# Patient Record
Sex: Male | Born: 1970 | Hispanic: Yes | Marital: Single | Smoking: Never smoker
Health system: Southern US, Community
[De-identification: ages and names within clinical notes are randomized; demographics above are authoritative.]

## PROBLEM LIST (undated history)

## (undated) DIAGNOSIS — I10 Essential (primary) hypertension: Secondary | ICD-10-CM

---

## 2017-11-24 ENCOUNTER — Emergency Department (HOSPITAL_COMMUNITY)
Admission: EM | Admit: 2017-11-24 | Discharge: 2017-11-24 | Disposition: A | Discharge status: Home / Self Care | Payer: Self-pay | Attending: Emergency Medicine | Admitting: Emergency Medicine

## 2017-11-24 ENCOUNTER — Encounter (HOSPITAL_COMMUNITY): Payer: Self-pay | Admitting: Emergency Medicine

## 2017-11-24 DIAGNOSIS — R51 Headache: Secondary | ICD-10-CM

## 2017-11-24 DIAGNOSIS — I160 Hypertensive urgency: Secondary | ICD-10-CM | POA: Insufficient documentation

## 2017-11-24 DIAGNOSIS — R519 Headache, unspecified: Secondary | ICD-10-CM

## 2017-11-24 HISTORY — DX: Essential (primary) hypertension: I10

## 2017-11-24 LAB — CBC WITH DIFFERENTIAL/PLATELET
BASOS ABS: 0 10*3/uL (ref 0.0–0.1)
BASOS PCT: 0 %
Eosinophils Absolute: 0 10*3/uL (ref 0.0–0.7)
Eosinophils Relative: 1 %
HCT: 47.8 % (ref 39.0–52.0)
Hemoglobin: 17.1 g/dL — ABNORMAL HIGH (ref 13.0–17.0)
Lymphocytes Relative: 27 %
Lymphs Abs: 1.6 10*3/uL (ref 0.7–4.0)
MCH: 33.4 pg (ref 26.0–34.0)
MCHC: 35.8 g/dL (ref 30.0–36.0)
MCV: 93.4 fL (ref 78.0–100.0)
MONOS PCT: 8 %
Monocytes Absolute: 0.5 10*3/uL (ref 0.1–1.0)
NEUTROS ABS: 4 10*3/uL (ref 1.7–7.7)
Neutrophils Relative %: 64 %
PLATELETS: 251 10*3/uL (ref 150–400)
RBC: 5.12 MIL/uL (ref 4.22–5.81)
RDW: 13.4 % (ref 11.5–15.5)
WBC: 6.2 10*3/uL (ref 4.0–10.5)

## 2017-11-24 LAB — COMPREHENSIVE METABOLIC PANEL
ALK PHOS: 72 U/L (ref 38–126)
ALT: 32 U/L (ref 17–63)
ANION GAP: 11 (ref 5–15)
AST: 30 U/L (ref 15–41)
Albumin: 4.7 g/dL (ref 3.5–5.0)
BUN: 12 mg/dL (ref 6–20)
CALCIUM: 10.2 mg/dL (ref 8.9–10.3)
CO2: 25 mmol/L (ref 22–32)
Chloride: 102 mmol/L (ref 101–111)
Creatinine, Ser: 0.87 mg/dL (ref 0.61–1.24)
GFR calc Af Amer: >60 mL/min (ref >60)
GFR calc non Af Amer: >60 mL/min (ref >60)
Glucose, Bld: 107 mg/dL — ABNORMAL HIGH (ref 65–99)
Potassium: 3.7 mmol/L (ref 3.5–5.1)
SODIUM: 138 mmol/L (ref 135–145)
TOTAL PROTEIN: 8.4 g/dL — AB (ref 6.5–8.1)
Total Bilirubin: 1.3 mg/dL — ABNORMAL HIGH (ref 0.3–1.2)

## 2017-11-24 MED ORDER — LABETALOL HCL 5 MG/ML IV SOLN
10.0000 mg | Freq: Once | INTRAVENOUS | Status: AC
  Administered 2017-11-24: 10 mg via INTRAVENOUS
  Filled 2017-11-24: qty 4

## 2017-11-24 MED ORDER — HYDROCHLOROTHIAZIDE 25 MG PO TABS: 25.0000 mg | ORAL_TABLET | Freq: Every day | ORAL | 1 refills | Status: DC

## 2017-11-24 MED ORDER — LOSARTAN POTASSIUM 50 MG PO TABS: 50.0000 mg | ORAL_TABLET | Freq: Every day | ORAL | 1 refills | Status: DC

## 2017-11-24 MED ORDER — ACETAMINOPHEN 325 MG PO TABS
650.0000 mg | ORAL_TABLET | Freq: Once | ORAL | Status: AC
  Administered 2017-11-24: 650 mg via ORAL
  Filled 2017-11-24: qty 2

## 2017-11-24 NOTE — ED Provider Notes (Signed)
[facility]-EMERGENCY DEPT Provider Note   CSN: [id number] Arrival date & time: 11/24/17  1817     History   Chief Complaint Chief Complaint  Patient presents with  . Dizziness  . Hypertension  . Nausea    HPI Curtis Knight is a 47 y.o. male.  HPI  47 year old male presents with headache, dizziness, and elevated blood pressure.  He states he has been off his blood pressure medicines, hydrochlorothiazide and losartan, for about 2 years.  He states that he developed a headache last night that was mild at onset and has gradually worsened.  Now it is about 8/10 and frontal.  There seems to be pain behind his eyes.  He is also had associated dizziness that feels like things are moving back and forth.  He vomited once.  Currently does not feel nauseated.  There is no blurry vision.  He does not have chest pain, shortness of breath, or weakness/numbness.  At a store he took his blood pressure and it was around 190/120.  He took 1 tablet each of his leftover medicines but they are from 2017.  This was around 1 PM.  He is also been complaining of random intermittent right upper quadrant pain that seems to come and go without obvious cause including not with eating.  There is no active abdominal pain.  Past Medical History:  Diagnosis Date  . Hypertension     There are no active problems to display for this patient.   History reviewed. No pertinent surgical history.      Home Medications    Prior to Admission medications   Medication Sig Start Date End Date Taking? Authorizing Provider  hydrochlorothiazide (HYDRODIURIL) 25 MG tablet Take 1 tablet (25 mg total) by mouth daily. 11/24/17   Pricilla Loveless, MD  losartan (COZAAR) 50 MG tablet Take 1 tablet (50 mg total) by mouth daily. 11/24/17   Pricilla Loveless, MD    Family History No family history on file.  Social History Social History   Tobacco Use  . Smoking status: Never Smoker  . Smokeless tobacco: Never  Used  Substance Use Topics  . Alcohol use: Yes  . Drug use: Not on file     Allergies   Patient has no known allergies.   Review of Systems Review of Systems  Eyes: Negative for visual disturbance.  Respiratory: Negative for shortness of breath.   Cardiovascular: Negative for chest pain.  Gastrointestinal: Positive for vomiting. Negative for abdominal pain and nausea.  Neurological: Positive for dizziness and headaches. Negative for weakness and numbness.  All other systems reviewed and are negative.    Physical Exam Updated Vital Signs BP (!) 171/109   Pulse 67   Temp 98.1 F (36.7 C) (Oral)   Resp 11   Ht  (1.676 m)   Wt 93 kg (205 lb)   SpO2 98%   BMI 33.09 kg/m   Physical Exam  Constitutional: He is oriented to person, place, and time. He appears well-developed and well-nourished. No distress.  HENT:  Head: Normocephalic and atraumatic.  Right Ear: External ear normal.  Left Ear: External ear normal.  Nose: Nose normal.  Eyes: Pupils are equal, round, and reactive to light. EOM are normal. Right eye exhibits no discharge. Left eye exhibits no discharge.  Neck: Neck supple.  Cardiovascular: Normal rate, regular rhythm and normal heart sounds.  Pulmonary/Chest: Effort normal and breath sounds normal.  Abdominal: Soft. There is no tenderness.  Musculoskeletal: He exhibits  no edema.  Neurological: He is alert and oriented to person, place, and time.  CN 3-12 grossly intact. 5/5 strength in all 4 extremities. Grossly normal sensation. Normal finger to nose.   Skin: Skin is warm and dry. He is not diaphoretic.  Nursing note and vitals reviewed.    ED Treatments / Results  Labs (all labs ordered are listed, but only abnormal results are displayed) Labs Reviewed  COMPREHENSIVE METABOLIC PANEL - Abnormal; Notable for the following components:      Result Value   Glucose, Bld 107 (*)    Total Protein 8.4 (*)    Total Bilirubin 1.3 (*)    All other  components within normal limits  CBC WITH DIFFERENTIAL/PLATELET - Abnormal; Notable for the following components:   Hemoglobin 17.1 (*)    All other components within normal limits    EKG EKG Interpretation  Date/Time:  Tuesday Nov 24 2017 18:42:03 EDT Ventricular Rate:  78 PR Interval:    QRS Duration: 112 QT Interval:  414 QTC Calculation: 472 R Axis:   -55 Text Interpretation:  Normal sinus rhythm LAD, consider left anterior fascicular block ST elev, probable normal early repol pattern Baseline wander in lead(s) V4 No old tracing to compare Confirmed by Pricilla Loveless [phone]) on 11/24/2017 6:56:53 PM   Radiology No results found.  Procedures Procedures (including critical care time)  Medications Ordered in ED Medications  labetalol (NORMODYNE,TRANDATE) injection 10 mg (10 mg Intravenous Given 11/24/17 1943)  acetaminophen (TYLENOL) tablet 650 mg (650 mg Oral Given 11/24/17 1934)     Initial Impression / Assessment and Plan / ED Course  I have reviewed the triage vital signs and the nursing notes.  Pertinent labs & imaging results that were available during my care of the patient were reviewed by me and considered in my medical decision making (see chart for details).     Patient's blood pressure has moderately improved with a dose of IV labetalol.  With Tylenol, his headache is now minimal.  He does not have any concerning findings on history exam such as weakness, numbness, blurry vision.  I highly doubt acute CNS pathology and think the headache is solely from the hypertension.  He does not show any other signs of hypertensive emergency such as cardiac symptoms or renal failure.  Given this, I think it is reasonable to restart him on his typical blood pressure medicines and have him follow-up with a PCP.  We discussed return precautions but I highly doubt CNS emergency such as subarachnoid hemorrhage, acute stroke, etc.  Final Clinical Impressions(s) / ED Diagnoses   Final  diagnoses:  Hypertensive urgency  Frontal headache    ED Discharge Orders        Ordered    hydrochlorothiazide (HYDRODIURIL) 25 MG tablet  Daily     11/24/17 2046    losartan (COZAAR) 50 MG tablet  Daily     11/24/17 2046       Pricilla Loveless, MD 11/24/17 2352

## 2017-11-24 NOTE — Discharge Instructions (Signed)
It is very important that you follow-up with a primary care doctor.  If you develop worsening headache, dizziness, or if you notice weakness or numbness in your arms or legs or if you develop chest pain or shortness of breath, or other new/concerning symptoms then return to the ER for evaluation.  Otherwise follow-up with a primary care physician for outpatient management of your blood pressure.

## 2017-11-24 NOTE — ED Notes (Signed)
Bed: WA06 Expected date:  Expected time:  Means of arrival:  Comments: HLD TRIAGE 1

## 2017-11-24 NOTE — ED Triage Notes (Signed)
Pt c/o dizziness, HTN and nausea that started this MA. Repots hasnt had HTN meds in while due to not having PCP here. Denies v/d or urinary problems.

## 2017-11-25 ENCOUNTER — Emergency Department (HOSPITAL_COMMUNITY)
Admission: EM | Admit: 2017-11-25 | Discharge: 2017-11-26 | Disposition: A | Discharge status: Home / Self Care | Payer: Self-pay | Attending: Emergency Medicine | Admitting: Emergency Medicine

## 2017-11-25 ENCOUNTER — Encounter (HOSPITAL_COMMUNITY): Payer: Self-pay | Admitting: Emergency Medicine

## 2017-11-25 ENCOUNTER — Other Ambulatory Visit: Payer: Self-pay

## 2017-11-25 DIAGNOSIS — R42 Dizziness and giddiness: Secondary | ICD-10-CM | POA: Insufficient documentation

## 2017-11-25 DIAGNOSIS — I10 Essential (primary) hypertension: Secondary | ICD-10-CM | POA: Insufficient documentation

## 2017-11-25 LAB — URINALYSIS, ROUTINE W REFLEX MICROSCOPIC
BACTERIA UA: NONE SEEN
Bilirubin Urine: NEGATIVE
GLUCOSE, UA: NEGATIVE mg/dL
HGB URINE DIPSTICK: NEGATIVE
Ketones, ur: 5 mg/dL — AB
LEUKOCYTES UA: NEGATIVE
NITRITE: NEGATIVE
PROTEIN: 100 mg/dL — AB
SPECIFIC GRAVITY, URINE: 1.026 (ref 1.005–1.030)
pH: 5 (ref 5.0–8.0)

## 2017-11-25 LAB — CBC
HEMATOCRIT: 48.6 % (ref 39.0–52.0)
Hemoglobin: 17.6 g/dL — ABNORMAL HIGH (ref 13.0–17.0)
MCH: 33.8 pg (ref 26.0–34.0)
MCHC: 36.2 g/dL — AB (ref 30.0–36.0)
MCV: 93.3 fL (ref 78.0–100.0)
PLATELETS: 261 10*3/uL (ref 150–400)
RBC: 5.21 MIL/uL (ref 4.22–5.81)
RDW: 13.5 % (ref 11.5–15.5)
WBC: 9.9 10*3/uL (ref 4.0–10.5)

## 2017-11-25 LAB — COMPREHENSIVE METABOLIC PANEL
ALT: 35 U/L (ref 17–63)
AST: 25 U/L (ref 15–41)
Albumin: 4.7 g/dL (ref 3.5–5.0)
Alkaline Phosphatase: 72 U/L (ref 38–126)
Anion gap: 14 (ref 5–15)
BILIRUBIN TOTAL: 1.8 mg/dL — AB (ref 0.3–1.2)
BUN: 14 mg/dL (ref 6–20)
CO2: 26 mmol/L (ref 22–32)
Calcium: 10.1 mg/dL (ref 8.9–10.3)
Chloride: 96 mmol/L — ABNORMAL LOW (ref 101–111)
Creatinine, Ser: 0.9 mg/dL (ref 0.61–1.24)
Glucose, Bld: 131 mg/dL — ABNORMAL HIGH (ref 65–99)
POTASSIUM: 3.7 mmol/L (ref 3.5–5.1)
Sodium: 136 mmol/L (ref 135–145)
TOTAL PROTEIN: 8.8 g/dL — AB (ref 6.5–8.1)

## 2017-11-25 LAB — LIPASE, BLOOD: Lipase: 26 U/L (ref 11–51)

## 2017-11-25 LAB — CBG MONITORING, ED: GLUCOSE-CAPILLARY: 103 mg/dL — AB (ref 65–99)

## 2017-11-25 NOTE — ED Triage Notes (Signed)
Pt complaint of n/v since yesterday; associated dizziness.

## 2017-11-26 ENCOUNTER — Emergency Department (HOSPITAL_COMMUNITY): Payer: Self-pay

## 2017-11-26 MED ORDER — ONDANSETRON 8 MG PO TBDP: 8.0000 mg | ORAL_TABLET | Freq: Three times a day (TID) | ORAL | 0 refills | Status: AC | PRN

## 2017-11-26 MED ORDER — MECLIZINE HCL 25 MG PO TABS
25.0000 mg | ORAL_TABLET | Freq: Once | ORAL | Status: AC
Start: 2017-11-26 — End: 2017-11-26
  Administered 2017-11-26: 25 mg via ORAL
  Filled 2017-11-26: qty 1

## 2017-11-26 MED ORDER — ONDANSETRON HCL 4 MG/2ML IJ SOLN
4.0000 mg | Freq: Once | INTRAMUSCULAR | Status: AC
  Administered 2017-11-26: 4 mg via INTRAVENOUS
  Filled 2017-11-26: qty 2

## 2017-11-26 MED ORDER — MECLIZINE HCL 25 MG PO TABS: 25.0000 mg | ORAL_TABLET | Freq: Three times a day (TID) | ORAL | 0 refills | Status: AC | PRN

## 2017-11-26 MED ORDER — LORAZEPAM 2 MG/ML IJ SOLN
1.0000 mg | Freq: Once | INTRAMUSCULAR | Status: AC
  Administered 2017-11-26: 1 mg via INTRAVENOUS
  Filled 2017-11-26: qty 1

## 2017-11-26 NOTE — ED Provider Notes (Signed)
[facility]-EMERGENCY DEPT Provider Note   CSN: [id number] Arrival date & time: 11/25/17  1754     History   Chief Complaint Chief Complaint  Patient presents with  . Abdominal Pain    HPI Curtis Knight is a 47 y.o. male.  HPI 47 year old male complaining of acute onset dizziness described as the room spinning.  He reports nausea and vomiting associated with this.  He is never had vertiginous symptoms before.  Denies weakness of his arms or legs.  No facial asymmetry.  No difficulty with speech.  Denies headache.  Symptoms are mild to moderate in severity.  He states he feels better with his eyes closed.  He did have headache yesterday.  He was seen in the emergency department at that time.  Symptoms are mild in severity.  Seen the ER yesterday and much of this was thought to be secondary to hypertension.  He was discharged home with hydrochlorothiazide and losartan   Past Medical History:  Diagnosis Date  . Hypertension     There are no active problems to display for this patient.   History reviewed. No pertinent surgical history.      Home Medications    Prior to Admission medications   Medication Sig Start Date End Date Taking? Authorizing Provider  hydrochlorothiazide (HYDRODIURIL) 25 MG tablet Take 1 tablet (25 mg total) by mouth daily. 11/24/17  Yes Pricilla Loveless, MD  losartan (COZAAR) 50 MG tablet Take 1 tablet (50 mg total) by mouth daily. 11/24/17  Yes Pricilla Loveless, MD  meclizine (ANTIVERT) 25 MG tablet Take 1 tablet (25 mg total) by mouth 3 (three) times daily as needed for dizziness. 11/26/17   Azalia Bilis, MD  ondansetron (ZOFRAN ODT) 8 MG disintegrating tablet Take 1 tablet (8 mg total) by mouth every 8 (eight) hours as needed for nausea or vomiting. 11/26/17   Azalia Bilis, MD    Family History No family history on file.  Social History Social History   Tobacco Use  . Smoking status: Never Smoker  . Smokeless tobacco: Never Used    Substance Use Topics  . Alcohol use: Yes  . Drug use: Not on file     Allergies   Patient has no known allergies.   Review of Systems Review of Systems  All other systems reviewed and are negative.    Physical Exam Updated Vital Signs BP (!) 168/99 (BP Location: Left Arm)   Pulse 72   Temp 98.2 F (36.8 C) (Oral)   Resp 15   SpO2 99%   Physical Exam  Constitutional: He is oriented to person, place, and time. He appears well-developed and well-nourished.  HENT:  Head: Normocephalic and atraumatic.  Eyes: Pupils are equal, round, and reactive to light. EOM are normal.  Neck: Normal range of motion.  Cardiovascular: Normal rate, regular rhythm, normal heart sounds and intact distal pulses.  Pulmonary/Chest: Effort normal and breath sounds normal. No respiratory distress.  Abdominal: Soft. He exhibits no distension. There is no tenderness.  Musculoskeletal: Normal range of motion.  Neurological: He is alert and oriented to person, place, and time.  5/5 strength in major muscle groups of  bilateral upper and lower extremities. Speech normal. No facial asymetry.   Skin: Skin is warm and dry.  Psychiatric: He has a normal mood and affect. Judgment normal.  Nursing note and vitals reviewed.    ED Treatments / Results  Labs (all labs ordered are listed, but only abnormal results are displayed) Labs  Reviewed  COMPREHENSIVE METABOLIC PANEL - Abnormal; Notable for the following components:      Result Value   Chloride 96 (*)    Glucose, Bld 131 (*)    Total Protein 8.8 (*)    Total Bilirubin 1.8 (*)    All other components within normal limits  CBC - Abnormal; Notable for the following components:   Hemoglobin 17.6 (*)    MCHC 36.2 (*)    All other components within normal limits  URINALYSIS, ROUTINE W REFLEX MICROSCOPIC - Abnormal; Notable for the following components:   Color, Urine AMBER (*)    APPearance HAZY (*)    Ketones, ur 5 (*)    Protein, ur 100 (*)     All other components within normal limits  CBG MONITORING, ED - Abnormal; Notable for the following components:   Glucose-Capillary 103 (*)    All other components within normal limits  LIPASE, BLOOD    EKG EKG Interpretation  Date/Time:  Wednesday Nov 25 2017 18:22:00 EDT Ventricular Rate:  78 PR Interval:    QRS Duration: 106 QT Interval:  416 QTC Calculation: 474 R Axis:   -65 Text Interpretation:  Sinus rhythm LAD, consider left anterior fascicular block No old tracing to compare Confirmed by Azalia Bilis ([id number]) on 11/26/2017 4:36:52 AM   Radiology Ct Head Wo Contrast  Result Date: 11/26/2017 CLINICAL DATA:  47 year old male with headache.  Dizziness. EXAM: CT HEAD WITHOUT CONTRAST TECHNIQUE: Contiguous axial images were obtained from the base of the skull through the vertex without intravenous contrast. COMPARISON:  None. FINDINGS: Brain: There is mild cortical atrophy primarily over the frontal lobes, advanced for the patient's age. The gray-white matter discrimination is preserved. There is no acute intracranial hemorrhage. No mass effect or midline shift. No extra-axial fluid collection. Vascular: No hyperdense vessel or unexpected calcification. Skull: Normal. Negative for fracture or focal lesion. Sinuses/Orbits: No acute finding. Other: None IMPRESSION: 1. No acute intracranial pathology. 2. Mild bifrontal cortical atrophy, advanced for age. Electronically Signed   By: Elgie Collard M.D.   On: 11/26/2017 04:12    Procedures Procedures (including critical care time)  Medications Ordered in ED Medications  LORazepam (ATIVAN) injection 1 mg (1 mg Intravenous Given 11/26/17 0247)  ondansetron (ZOFRAN) injection 4 mg (4 mg Intravenous Given 11/26/17 0246)  meclizine (ANTIVERT) tablet 25 mg (25 mg Oral Given 11/26/17 0247)     Initial Impression / Assessment and Plan / ED Course  I have reviewed the triage vital signs and the nursing notes.  Pertinent labs & imaging results  that were available during my care of the patient were reviewed by me and considered in my medical decision making (see chart for details).     Improvement in the emergency department with meclizine and benzodiazepines.  No weakness of his arms or legs.  Doubt central process.  CT imaging without acute abnormality.  Ambulatory at time of discharge.  Primary care follow-up.  Patient understands return to the emergency department for new or worsening symptoms  Final Clinical Impressions(s) / ED Diagnoses   Final diagnoses:  Acute onset of severe vertigo    ED Discharge Orders        Ordered    meclizine (ANTIVERT) 25 MG tablet  3 times daily PRN     11/26/17 0437    ondansetron (ZOFRAN ODT) 8 MG disintegrating tablet  Every 8 hours PRN     11/26/17 0437       Azalia Bilis, MD 12/02/17  0725  

## 2017-12-03 ENCOUNTER — Inpatient Hospital Stay: Payer: Self-pay

## 2017-12-23 ENCOUNTER — Ambulatory Visit: Payer: Self-pay | Attending: Family Medicine | Admitting: Physician Assistant

## 2017-12-23 VITALS — BP 152/98 | HR 72 | Temp 98.8°F | Resp 18 | Ht 67.0 in | Wt 204.0 lb

## 2017-12-23 DIAGNOSIS — R739 Hyperglycemia, unspecified: Secondary | ICD-10-CM | POA: Insufficient documentation

## 2017-12-23 DIAGNOSIS — R51 Headache: Secondary | ICD-10-CM | POA: Insufficient documentation

## 2017-12-23 DIAGNOSIS — R519 Headache, unspecified: Secondary | ICD-10-CM

## 2017-12-23 DIAGNOSIS — R42 Dizziness and giddiness: Secondary | ICD-10-CM | POA: Insufficient documentation

## 2017-12-23 DIAGNOSIS — R112 Nausea with vomiting, unspecified: Secondary | ICD-10-CM | POA: Insufficient documentation

## 2017-12-23 DIAGNOSIS — Z7689 Persons encountering health services in other specified circumstances: Secondary | ICD-10-CM | POA: Insufficient documentation

## 2017-12-23 DIAGNOSIS — Z09 Encounter for follow-up examination after completed treatment for conditions other than malignant neoplasm: Secondary | ICD-10-CM

## 2017-12-23 DIAGNOSIS — Z79899 Other long term (current) drug therapy: Secondary | ICD-10-CM | POA: Insufficient documentation

## 2017-12-23 DIAGNOSIS — I10 Essential (primary) hypertension: Secondary | ICD-10-CM | POA: Insufficient documentation

## 2017-12-23 MED ORDER — LOSARTAN POTASSIUM 100 MG PO TABS: 100.0000 mg | ORAL_TABLET | Freq: Every day | ORAL | 3 refills | Status: AC

## 2017-12-23 MED ORDER — HYDROCHLOROTHIAZIDE 25 MG PO TABS: 25.0000 mg | ORAL_TABLET | Freq: Every day | ORAL | 3 refills | Status: AC

## 2017-12-23 MED ORDER — LOSARTAN POTASSIUM 100 MG PO TABS: 100.0000 mg | ORAL_TABLET | Freq: Every day | ORAL | 3 refills | Status: DC

## 2017-12-23 MED ORDER — HYDROCHLOROTHIAZIDE 25 MG PO TABS: 25.0000 mg | ORAL_TABLET | Freq: Every day | ORAL | 1 refills | Status: DC

## 2017-12-23 MED ORDER — HYDROCHLOROTHIAZIDE 25 MG PO TABS: 25.0000 mg | ORAL_TABLET | Freq: Every day | ORAL | 3 refills | Status: DC

## 2017-12-23 MED FILL — HYDROCHLOROTHIAZIDE 25 MG T: 25 | 30 days supply | Qty: 30 | Fill #0

## 2017-12-23 MED FILL — LOSARTAN POTASSIUM 100 MG T: 100 | 30 days supply | Qty: 30 | Fill #0

## 2017-12-23 NOTE — Patient Instructions (Addendum)
Check blood pressure daily and record and bring to next visit.  Goal is <130/85. Drink 80 ounces of water daily.  Hiperglucemia (Hyperglycemia) La hiperglucemia se produce cuando el nivel de azcar (glucosa) en la sangre es demasiado alto. Algunos de los sntomas de nivel alto de azcar en la sangre son los siguientes:  Sentir que tiene lo siguiente: ? Estar ms sediento que lo habitual. ? Sentirse cansado. ? Estar dbil.  Tener lo siguiente: ? La boca seca. ? Cambios en la visin, como visin borrosa. ? Aliento con [employer] a fruta. ? Aumento o prdida de peso no planificados. Probablemente pierda de peso muy rpidamente. ? Hormigueo o adormecimiento en las manos o los pies. ? Dolor de [place]. ? Cuando se pellizca la piel, esta no vuelve rpidamente a su lugar al soltarla. ? Dolor en el vientre (abdomen). ? Cortes o hematomas que tardan en curarse.  Orinar con mayor frecuencia que lo normal.  Tener ms hambre de lo habitual.  Tener menos deseos de comer que lo habitual (prdida del apetito). La hiperglucemia le puede ocurrir tanto a las personas que tienen diabetes como a las que no la tienen. Puede desarrollarse despacio o rpidamente y ser una emergencia mdica. CUIDADOS EN EL HOGAR  Si tiene diabetes, siga su plan de control de la diabetes. Haga lo siguiente: ? [employer]. ? Siga el plan de ejercicio. ? Siga el plan de comidas. ? Controle su nivel de azcar en la sangre peridicamente. ? Controle su nivel de azcar en la sangre antes y despus de ejercitarse. Si hace ejercicio durante ms tiempo o de [employer] de lo habitual, asegrese de Chief Operating Officer su nivel de azcar en la sangre con mayor frecuencia. ? Use su pulsera de alerta mdica, que indica que usted tiene diabetes.  Beba suficiente lquido para mantener el pis claro o de color amarillo plido.  Mantenga un peso saludable con dieta y ejercicios. Pregntele a su mdico cul es su  peso ideal.  No consuma ningn producto que contenga tabaco, lo que incluye cigarrillos, tabaco de Theatre manager y Administrator, Civil Service. Si necesita ayuda para dejar de fumar, consulte al mdico.  Limite el consumo de alcohol a no ms de por da si es mujer y no est embarazada y a por da si es hombre. Una medida equivale a 12onzas de cerveza, 5onzas de vino o 1onzas de bebidas alcohlicas de alta graduacin.  Concurra a todas las visitas de control como se lo haya indicado el mdico. Esto es importante.  SOLICITE AYUDA SI:  Su nivel de azcar en la sangre est por encima del rango indicado en dos mediciones seguidas.  Tiene episodios frecuentes hiperglucemia.  SOLICITE AYUDA DE INMEDIATO SI:  Tiene dificultad para respirar.  Tiene cambios en la manera se sentirse, pensar o actuar (estado mental).  Tiene ganas constantes de vomitar (nuseas).  No puede dejar de vomitar. Estos sntomas pueden Customer service manager. No espere hasta que los sntomas desaparezcan. Solicite atencin mdica de inmediato. Comunquese con el servicio de emergencias de su localidad (911 en los [place]). No conduzca por sus propios medios [employer]. Esta informacin no tiene Theme park manager el consejo del mdico. Asegrese de hacerle al mdico cualquier pregunta que tenga. Document Released: 08/09/2010 Document Revised: 10/29/2015 Document Reviewed: 03/13/2015 Elsevier Interactive Patient Education  2017 Elsevier Inc.   Hipertensin Hypertension La hipertensin, conocida comnmente como presin arterial alta, se produce cuando la sangre bombea en las arterias con [place]  fuerza. Las arterias son los vasos sanguneos que transportan la sangre desde el corazn al resto del cuerpo. La hipertensin hace que el corazn haga ms esfuerzo para Insurance account manager y [employer] Holdings Corporation que las arterias se Armed forces training and education officer o Multimedia programmer. La hipertensin no tratada o no controlada puede causar  infarto de miocardio, accidentes cerebrovasculares, enfermedad renal y otros problemas. Una lectura de la presin arterial consiste de un nmero ms alto sobre un nmero ms bajo. En condiciones ideales, la presin arterial debe estar por debajo de 120/80. El primer nmero ("superior") es la presin sistlica. Es la medida de la presin de las arterias cuando el corazn late. El segundo nmero ("inferior") es la presin diastlica. Es la medida de la presin en las arterias cuando el corazn se relaja. Cules son las causas? Se desconoce la causa de esta afeccin. Qu incrementa el riesgo? Algunos factores de riesgo de hipertensin estn bajo su control. Otros no. Factores que puede [employer].  Tener diabetes mellitus tipo 2, colesterol alto, o ambos.  No hacer la cantidad suficiente de actividad fsica o ejercicio.  Tener sobrepeso.  Consumir mucha grasa, azcar, caloras o sal (sodio) en su dieta.  Beber alcohol en exceso. Factores que son difciles o imposibles de modificar  Tener enfermedad renal crnica.  Tener antecedentes familiares de presin arterial alta.  La edad. Los riesgos aumentan con la edad.  La raza. El riesgo es mayor para las Statistician.  El sexo. Antes de los 45aos, los hombres corren ms [employer]. Despus de los 65aos, las mujeres corren ms [employer].  Tener apnea obstructiva del sueo.  El estrs. Cules son los signos o los sntomas? La presin arterial extremadamente alta (crisis hipertensiva) puede provocar:  Dolor de [place].  Ansiedad.  Falta de aire.  Hemorragia nasal.  Nuseas y vmitos.  Dolor de pecho intenso.  Una crisis de movimientos que no puede controlar (convulsiones).  Cmo se diagnostica? Esta afeccin se diagnostica midiendo su presin arterial mientras se encuentra sentado, con el brazo apoyado sobre una superficie. El brazalete del tensimetro debe colocarse  directamente sobre la piel de la parte superior del brazo y al nivel de su corazn. Debe medirla al [facility] veces en el mismo brazo. Determinadas condiciones pueden causar una diferencia de presin arterial entre el brazo izquierdo y Aeronautical engineer. Ciertos factores pueden provocar que las lecturas de la presin arterial sean inferiores o superiores a lo normal (elevadas) por un perodo corto de tiempo:  Si su presin arterial es ms alta cuando se encuentra en el consultorio del mdico que cuando la mide en su hogar, se denomina "hipertensin de bata blanca". La Harley[employer]Davidson de las personas que tienen esta afeccin no deben ser [employer].  Si su presin arterial es ms alta en el hogar que cuando se encuentra en el consultorio del mdico, se denomina "hipertensin enmascarada". La Harley[employer]Davidson de las personas que tienen esta afeccin deben ser medicadas para Chief Operating Officer la presin arterial.  Si tiene una lecturas de presin arterial alta durante una visita o si tiene presin arterial normal con otros factores de riesgo:  Es posible que se le pida que regrese Banker para volver a Chief Operating Officer su presin arterial.  Se le puede pedir que se controle la presin arterial en su casa durante 1 semana o ms.  Si se le diagnostica hipertensin, es posible que se le realicen otros anlisis de sangre o estudios de diagnstico por imgenes para ayudar a su mdico a comprender  su riesgo general de Physiological scientisttener otras afecciones. Cmo se trata? Esta afeccin se trata haciendo cambios saludables en el estilo de vida, tales como ingerir alimentos saludables, realizar ms ejercicio y reducir el consumo de alcohol. El mdico puede recetarle medicamentos si los cambios en el estilo de vida no son suficientes para Museum/gallery curatorlograr controlar la presin arterial y si:  Su presin arterial sistlica est por encima de 130.  Su presin arterial diastlica est por encima de 80.  La presin arterial deseada puede variar en funcin de las  enfermedades, la edad y otros factores personales. Siga estas instrucciones en su casa: Comida y bebida  Siga una dieta con alto contenido de fibras y McAlmontpotasio, y con bajo contenido de sodio, International aid/development workerazcar agregada y Neurosurgeongrasas. Un ejemplo de plan alimenticio es la dieta DASH (Dietary Approaches to Stop Hypertension, Mtodos alimenticios para detener la hipertensin). Para alimentarse de esta manera: ? Coma mucha fruta y verdura fresca. Trate de que la mitad del plato de cada comida sea de frutas y verduras. ? Coma cereales integrales, como pasta integral, arroz integral y pan integral. Llene aproximadamente un cuarto del plato con cereales integrales. ? Coma y beba productos lcteos con bajo contenido de grasa, como leche descremada o yogur bajo en grasas. ? Evite la ingesta de cortes de carne grasa, carne procesada o curada, y carne de ave con piel. Llene aproximadamente un cuarto del plato con protenas magras, como pescado, pollo sin piel, frijoles, huevos y tofu. ? Evite ingerir alimentos prehechos o procesados. En general, estos tienen mayor cantidad de sodio, azcar agregada y Steffanie Rainwatergrasa.  Reduzca su ingesta diaria de sodio. La mayora de las personas que tienen hipertensin deben comer menos de 1500 mg de sodio por C.H. Robinson Worldwideda.  Limite el consumo de alcohol a no ms de 1 medida por da si es mujer y no est Orthoptistembarazada y a 2 medidas por da si es hombre. Una medida equivale a 12onzas de cerveza, 5onzas de vino o 1onzas de bebidas alcohlicas de alta graduacin. Estilo de vida  Trabaje con su mdico para mantener un peso saludable o Curatorperder peso. Pregntele cual es su peso recomendado.  Realice al menos 30 minutos de ejercicio que haga que se acelere su corazn (ejercicio Magazine features editoraerbico) la DIRECTVmayora de los das de la [place]semana. Estas actividades pueden incluir caminar, nadar o andar en bicicleta.  Incluya ejercicios para fortalecer sus msculos (ejercicios de resistencia), como pilates o levantamiento de pesas, como parte  de su rutina semanal de ejercicios. Intente realizar 30minutos de este tipo de ejercicios al Kelloggmenos tres das a la [place]semana.  No consuma ningn producto que contenga nicotina o tabaco, como cigarrillos y Administrator, Civil Servicecigarrillos electrnicos. Si necesita ayuda para dejar de fumar, consulte al mdico.  Contrlese la presin arterial en su casa segn las indicaciones del mdico.  Concurra a todas las visitas de control como se lo haya indicado el mdico. Esto es importante. Medicamentos  Baxter Internationalome los medicamentos de venta libre y los recetados solamente como se lo haya indicado el mdico. Siga cuidadosamente las indicaciones. Los medicamentos para la presin arterial deben tomarse segn las indicaciones.  No omita las dosis de medicamentos para la presin arterial. Si lo hace, estar en riesgo de tener problemas y puede hacer que los medicamentos sean menos eficaces.  Pregntele a su mdico a qu efectos secundarios o reacciones a los Museum/gallery curatormedicamentos debe prestar atencin. Comunquese con un mdico si:  Piensa que tiene una reaccin a un medicamento que est tomando.  Tiene dolores de [place]  frecuentes (recurrentes).  Siente mareos.  Tiene hinchazn en los tobillos.  Tiene problemas de visin. Solicite ayuda de inmediato si:  Siente un dolor de cabeza intenso o confusin.  Siente debilidad inusual o adormecimiento.  Siente que va a desmayarse.  Siente un dolor intenso en el pecho o el abdomen.  Vomita repetidas veces.  Tiene dificultad para respirar. Resumen  La hipertensin se produce cuando la sangre bombea en las arterias con mucha fuerza. Si esta afeccin no se controla, podra correr riesgo de tener complicaciones graves.  La presin arterial deseada puede variar en funcin de las enfermedades, la edad y otros factores personales. Para la [employer], una presin arterial normal es menor que 120/80.  La hipertensin se trata con cambios en el estilo de vida, medicamentos o una  combinacin de [place]. Los [employer] Corporation estilo de vida incluyen prdida de peso, ingerir alimentos sanos, seguir una dieta baja en sodio, hacer ms ejercicio y Glass blower/designer consumo de alcohol. Esta informacin no tiene Theme park manager el consejo del mdico. Asegrese de hacerle al mdico cualquier pregunta que tenga. Document Released: 07/07/2005 Document Revised: 06/18/2016 Document Reviewed: 06/18/2016 Elsevier Interactive Patient Education  [employer].

## 2017-12-23 NOTE — Progress Notes (Signed)
Patient ID: Curtis Knight, male   DOB: 02-Nov-1970, 47 y.o.   MRN: [id number]    Curtis Knight, is a 47 y.o. male  [id number]  [id number]  DOB - 1971-04-10  Subjective:  Chief Complaint and HPI: Curtis Knight is a 47 y.o. male here today to establish care and for a follow up visit Seen in ED 11/24/2017 and 11/25/2017: complaining of acute onset dizziness described as the room spinning.  He reports nausea and vomiting associated with this.  He is never had vertiginous symptoms before.  Denies weakness of his arms or legs.  No facial asymmetry.  No difficulty with speech.  Denies headache.  Symptoms are mild to moderate in severity.  He states he feels better with his eyes closed.  He did have headache yesterday.  He was seen in the emergency department at that time.  Symptoms are mild in severity.  Seen the ER yesterday and much of this was thought to be secondary to hypertension.  He was discharged home with hydrochlorothiazide and losartan on 11/24/2017.  EKG: Ventricular Rate:         78 PR Interval:                   QRS Duration: 106 QT Interval:                 416 QTC Calculation:        474 R Axis:                         -65 Text Interpretation:       Sinus rhythm LAD, consider left anterior fascicular block No old tracing to compare Confirmed by Azalia Bilis ([id number]) on 11/26/2017 4:36:52 AM  From A/P:  Improvement in the emergency department with meclizine and benzodiazepines.  No weakness of his arms or legs.  Doubt central process.  CT imaging without acute abnormality.  Ambulatory at time of discharge.  Primary care follow-up.  Patient understands return to the emergency department for new or worsening symptoms  ED/Hospital notes reviewed and summarized above.  Today, he presents with improved dizziness and HA.  He has a home BP cuff and all readings he shows me still too high.  (>140/90).  He was also found to have mild hyperglycemia at the ED.    Social:  Married; wife  present  ROS:   Constitutional:  No f/c, No night sweats, No unexplained weight loss. EENT:  No vision changes, No blurry vision, No hearing changes. No mouth, throat, or ear problems.  Respiratory: No cough, No SOB Cardiac: No CP, no palpitations GI:  No abd pain, No N/V/D. GU: No Urinary s/sx Musculoskeletal: No joint pain Neuro: improving headache, improving dizziness, no motor weakness.  Skin: No rash Endocrine:  No polydipsia. No polyuria.  Psych: Denies SI/HI  No problems updated.  ALLERGIES: No Known Allergies  PAST MEDICAL HISTORY: Past Medical History:  Diagnosis Date  . Hypertension     MEDICATIONS AT HOME: Prior to Admission medications   Medication Sig Start Date End Date Taking? Authorizing Provider  hydrochlorothiazide (HYDRODIURIL) 25 MG tablet Take 1 tablet (25 mg total) by mouth daily. 12/23/17  Yes Shirley Decamp M, PA-C  meclizine (ANTIVERT) 25 MG tablet Take 1 tablet (25 mg total) by mouth 3 (three) times daily as needed for dizziness. 11/26/17  Yes Azalia Bilis, MD  ondansetron (ZOFRAN ODT) 8 MG disintegrating tablet Take 1 tablet (8 mg total) by mouth  every 8 (eight) hours as needed for nausea or vomiting. 11/26/17  Yes Azalia Bilisampos, Kevin, MD  losartan (COZAAR) 100 MG tablet Take 1 tablet (100 mg total) by mouth daily. 12/23/17   Anders SimmondsMcClung, Cortasia Screws M, PA-C     Objective:  EXAM:   Vitals:   12/23/17 1001  BP: (!) 152/98  Pulse: 72  Resp: 18  Temp: 98.8 F (37.1 C)  TempSrc: Oral  SpO2: 98%  Weight: 204 lb (92.5 kg)  Height: 5\' 7"  (1.702 m)    General appearance : A&OX3. NAD. Non-toxic-appearing HEENT: Atraumatic and Normocephalic.  PERRLA. EOM intact.  Neck: supple, no JVD. No cervical lymphadenopathy. No thyromegaly Chest/Lungs:  Breathing-non-labored, Good air entry bilaterally, breath sounds normal without rales, rhonchi, or wheezing  CVS: S1 S2 regular, no murmurs, gallops, rubs  Extremities: Bilateral Lower Ext shows no edema, both legs are warm to  touch with = pulse throughout Neurology:  CN II-XII grossly intact, Non focal.   Psych:  TP linear. J/I WNL. Normal speech. Appropriate eye contact and affect.  Skin:  No Rash  Data Review No results found for: HGBA1C   Assessment & Plan   1. Hypertension, unspecified type Not at goal - Comprehensive metabolic panel Stop 50mg  losartan and increase dose- losartan (COZAAR) 100 MG tablet; Take 1 tablet (100 mg total) by mouth daily.  Dispense: 30 tablet; Refill: 3 - hydrochlorothiazide (HYDRODIURIL) 25 MG tablet; Take 1 tablet (25 mg total) by mouth daily.  Dispense: 30 tablet; Refill: 3  2. Hyperglycemia I have had a lengthy discussion and provided education about insulin resistance and the intake of too much sugar/refined carbohydrates.  I have advised the patient to work at a goal of eliminating sugary drinks, candy, desserts, sweets, refined sugars, processed foods, and white carbohydrates.  The patient expresses understanding.   - Comprehensive metabolic panel - Hemoglobin A1c  3. Encounter for examination following treatment at hospital improving  4. Dizziness Improving, mostly resolved  5. Acute nonintractable headache, unspecified headache type Improving, mostly resolved   Patient have been counseled extensively about nutrition and exercise  Return in about 3 weeks (around 01/13/2018) for assign new PCP; assess BP.  The patient was given clear instructions to go to ER or return to medical center if symptoms don't improve, worsen or new problems develop. The patient verbalized understanding. The patient was told to call to get lab results if they haven't heard anything in the next week.     Curtis CoAngela Korey Prashad, PA-C [facility] [place], [place] [phone]   12/23/2017, 10:18 AM

## 2017-12-24 LAB — COMPREHENSIVE METABOLIC PANEL
ALK PHOS: 74 IU/L (ref 39–117)
ALT: 28 IU/L (ref 0–44)
AST: 20 IU/L (ref 0–40)
Albumin/Globulin Ratio: 1.7 (ref 1.2–2.2)
Albumin: 4.6 g/dL (ref 3.5–5.5)
BUN/Creatinine Ratio: 14 (ref 9–20)
BUN: 13 mg/dL (ref 6–24)
Bilirubin Total: 0.7 mg/dL (ref 0.0–1.2)
CHLORIDE: 100 mmol/L (ref 96–106)
CO2: 22 mmol/L (ref 20–29)
Calcium: 10 mg/dL (ref 8.7–10.2)
Creatinine, Ser: 0.94 mg/dL (ref 0.76–1.27)
GFR calc Af Amer: 110 mL/min/{1.73_m2} (ref >59)
GFR calc non Af Amer: 95 mL/min/{1.73_m2} (ref >59)
GLUCOSE: 115 mg/dL — AB (ref 65–99)
Globulin, Total: 2.7 g/dL (ref 1.5–4.5)
Potassium: 4 mmol/L (ref 3.5–5.2)
Sodium: 138 mmol/L (ref 134–144)
Total Protein: 7.3 g/dL (ref 6.0–8.5)

## 2017-12-24 LAB — HEMOGLOBIN A1C
ESTIMATED AVERAGE GLUCOSE: 114 mg/dL
HEMOGLOBIN A1C: 5.6 % (ref 4.8–5.6)

## 2017-12-28 ENCOUNTER — Telehealth: Payer: Self-pay | Admitting: General Practice

## 2017-12-28 ENCOUNTER — Telehealth (INDEPENDENT_AMBULATORY_CARE_PROVIDER_SITE_OTHER): Payer: Self-pay | Admitting: *Deleted

## 2017-12-28 NOTE — Telephone Encounter (Signed)
-----   Message from Anders SimmondsAngela M McClung, [place] JerseyPA-C sent at 12/24/2017  8:19 AM EDT ----- Please call patient.  Labs were normal.  Follow-up as planned.  Thanks, Georgian CoAngela Mcclung, PA-C

## 2017-12-28 NOTE — Telephone Encounter (Signed)
Medical Assistant used [employer] to contact patient.  Interpreter Name:Francisco Interpreter #: [phone]  MA unable to reach patient due to home number being answered and "person" stated Zaniel does not reside at that telephone number. MA attempted to reach emergency contact with no success. Medical Assistant left message on patient's home and cell voicemail. Voicemail states to give a call back to [place] [facility] at [phone].

## 2017-12-28 NOTE — Telephone Encounter (Signed)
Patient called  And he is aware of the Normal results

## 2018-01-01 ENCOUNTER — Ambulatory Visit: Payer: Self-pay

## 2018-01-04 MED FILL — LOSARTAN POTASSIUM 100 MG T: 100 | 30 days supply | Qty: 30 | Fill #1

## 2018-01-04 MED FILL — HYDROCHLOROTHIAZIDE 25 MG T: 25 | 30 days supply | Qty: 30 | Fill #1

## 2018-01-15 ENCOUNTER — Encounter

## 2018-01-20 ENCOUNTER — Ambulatory Visit: Payer: Self-pay | Admitting: Nurse Practitioner

## 2018-05-03 MED FILL — LOSARTAN POTASSIUM 100 MG T: 100 | 30 days supply | Qty: 30 | Fill #2

## 2018-05-03 MED FILL — HYDROCHLOROTHIAZIDE 25 MG T: 25 | 30 days supply | Qty: 30 | Fill #2

## 2020-04-16 IMAGING — CT CT HEAD W/O CM
3 series · 16 of 47 positions shown, 19 images · non-contrast
Comparison: None.

CLINICAL DATA: 46-year-old male with headache.  Dizziness.

EXAM:
CT HEAD WITHOUT CONTRAST
TECHNIQUE: Contiguous axial images were obtained from the base of the skull
through the vertex without intravenous contrast.

[Series 2: head wo · axial · 0.47mm/px · z∈[-59,+71]mm · 10 of 32 slices shown, 13 images]
[im 3/32  brain]
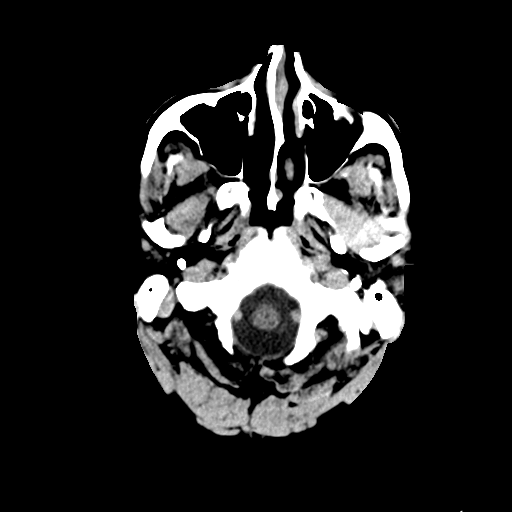
[im 3/32  bone]
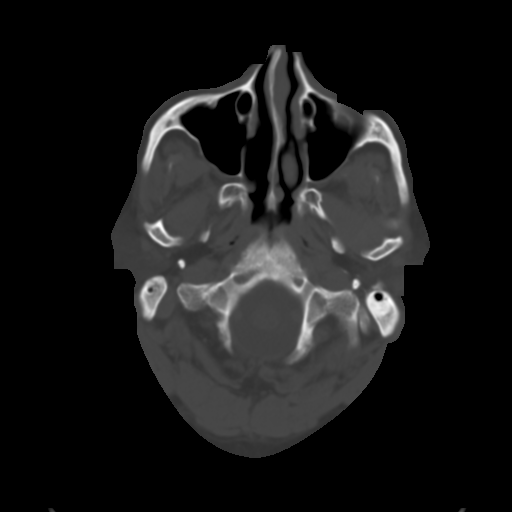
[im 6/32  brain]
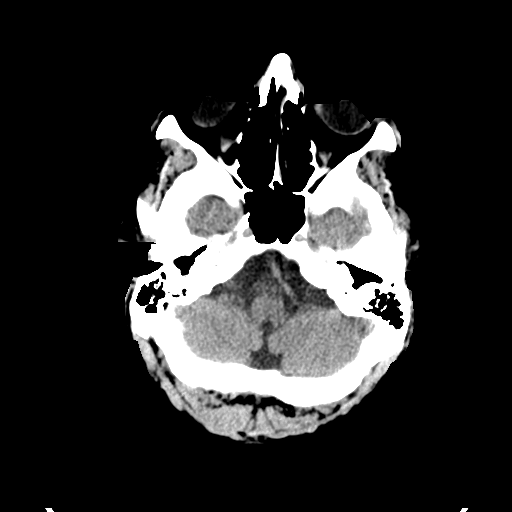
[im 9/32  brain]
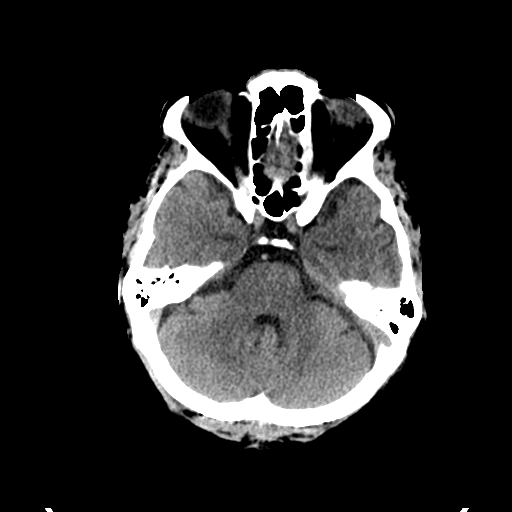
[im 11/32  brain]
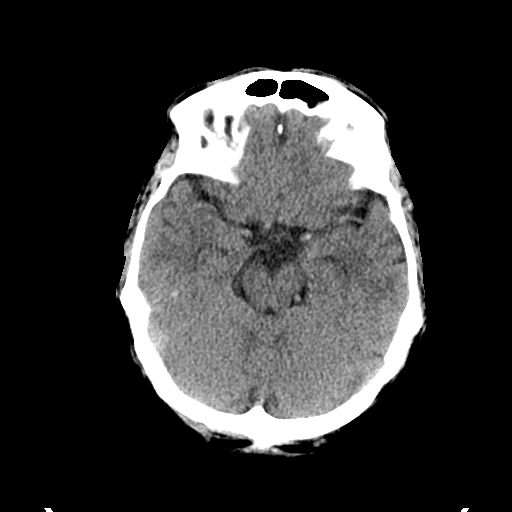
[im 14/32  brain]
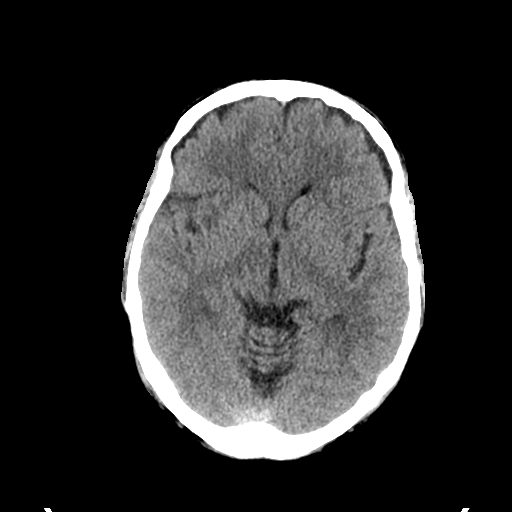
[im 14/32  bone]
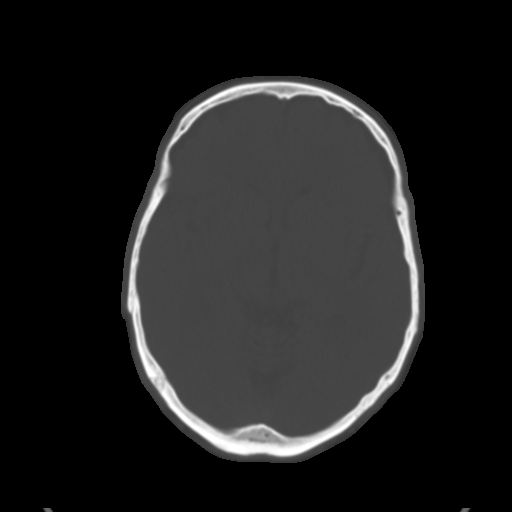
[im 18/32  brain]
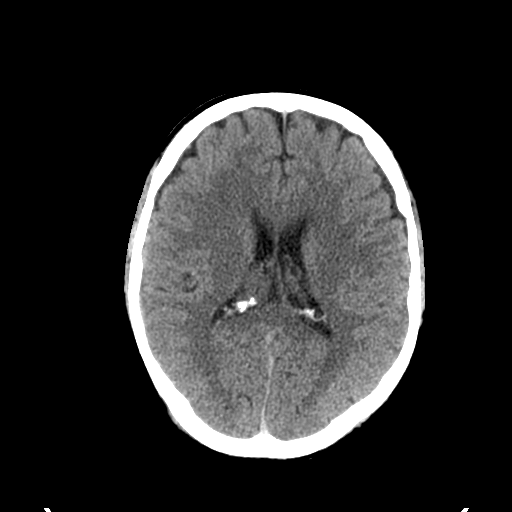
[im 21/32  brain]
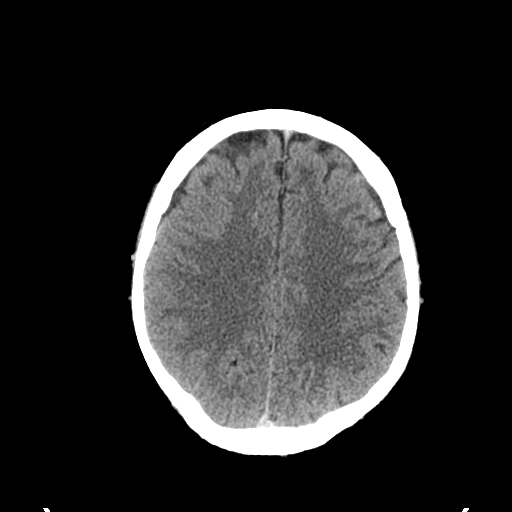
[im 24/32  brain]
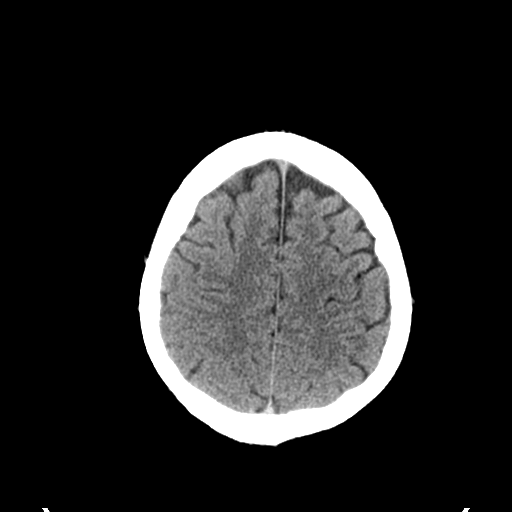
[im 26/32  brain]
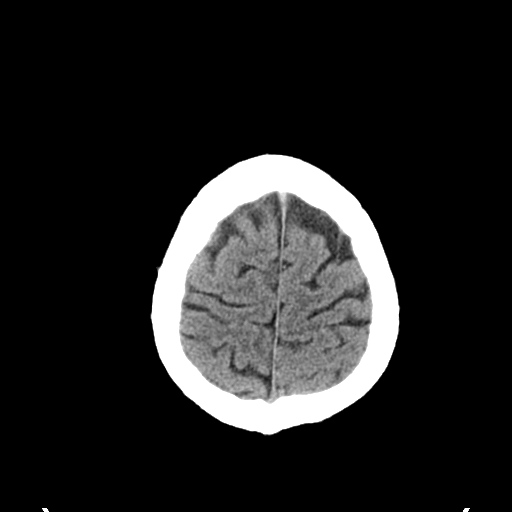
[im 26/32  bone]
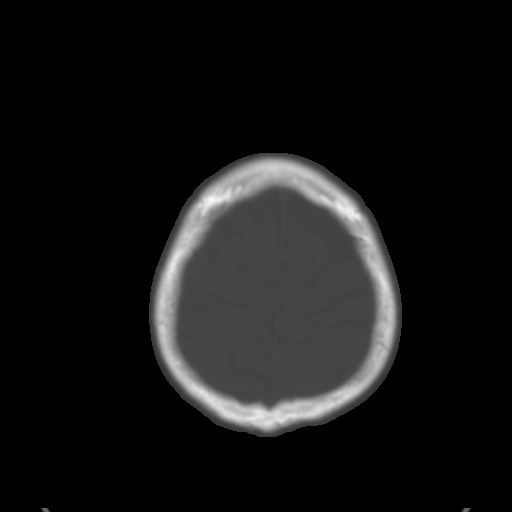
[im 29/32  brain]
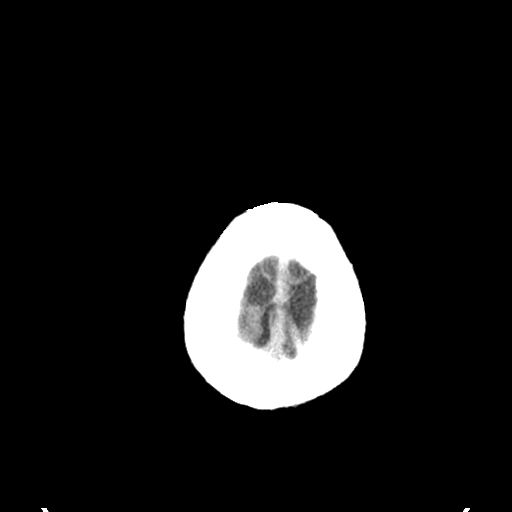

[Series 4: coronal soft tissue · coronal · 0.29mm/px · 3 of 61 slices shown]
[im 21/61  brain]
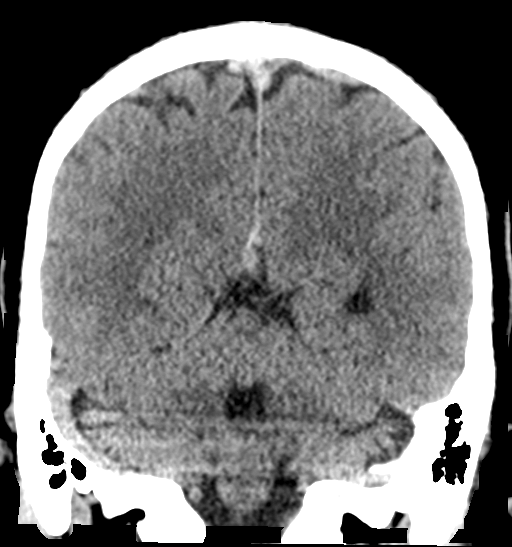
[im 27/61  brain]
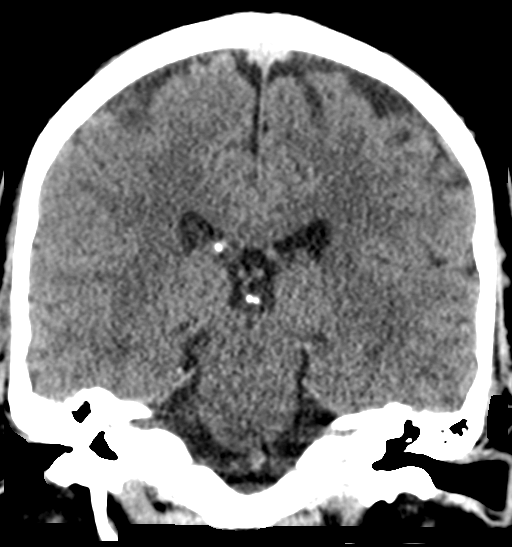
[im 34/61  brain]
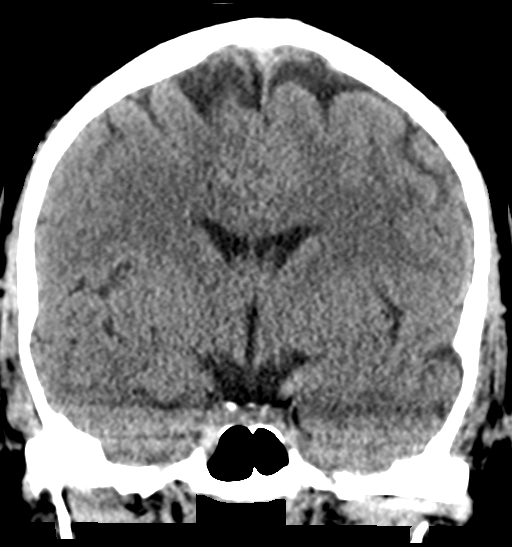

[Series 5: sagittal soft tissue · sagittal · 0.30mm/px · 3 of 50 slices shown]
[im 17/50  brain]
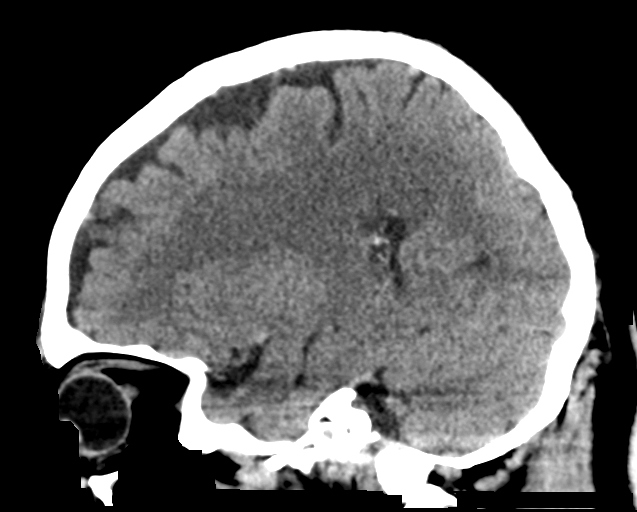
[im 25/50  brain]
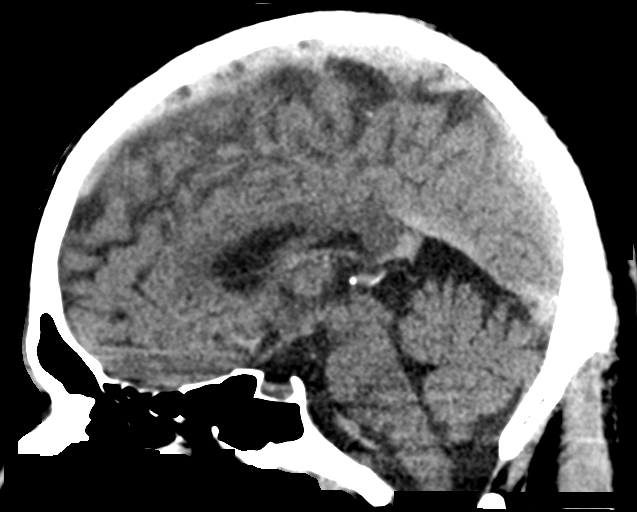
[im 33/50  brain]
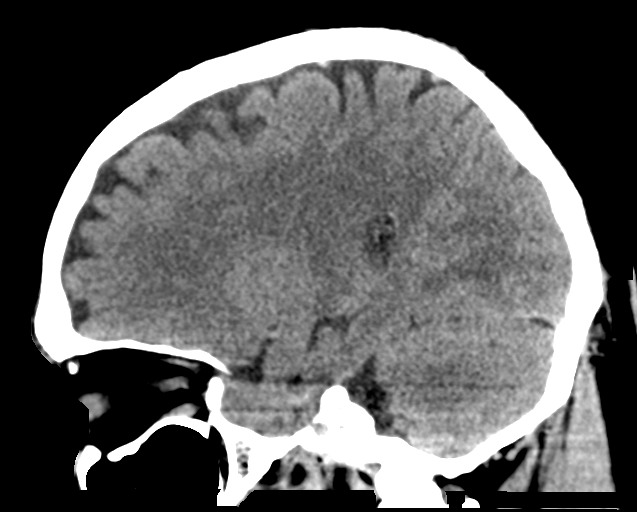

[16 of 47 positions shown; findings below may reference images not displayed]

FINDINGS: Brain: There is mild cortical atrophy primarily over the frontal
lobes, advanced for the patient's age. The gray-white matter
discrimination is preserved. There is no acute intracranial
hemorrhage. No mass effect or midline shift. No extra-axial fluid
collection.

Vascular: No hyperdense vessel or unexpected calcification.

Skull: Normal. Negative for fracture or focal lesion.

Sinuses/Orbits: No acute finding.

Other: None
IMPRESSION: 1. No acute intracranial pathology.
2. Mild bifrontal cortical atrophy, advanced for age.
